# Patient Record
Sex: Female | Born: 1955 | Race: Black or African American | Marital: Married | Smoking: Never smoker
Health system: Southern US, Community
[De-identification: ages and names within clinical notes are randomized; demographics above are authoritative.]

## PROBLEM LIST (undated history)

## (undated) DIAGNOSIS — I639 Cerebral infarction, unspecified: Secondary | ICD-10-CM

---
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
## 2012-12-17 ENCOUNTER — Other Ambulatory Visit: Payer: Self-pay | Admitting: Gastroenterology

## 2012-12-17 DIAGNOSIS — D3A00 Benign carcinoid tumor of unspecified site: Secondary | ICD-10-CM

## 2012-12-19 ENCOUNTER — Ambulatory Visit
Admission: RE | Admit: 2012-12-19 | Discharge: 2012-12-19 | Disposition: A | Discharge status: Home / Self Care | Payer: 59 | Source: Ambulatory Visit | Attending: Gastroenterology | Admitting: Gastroenterology

## 2012-12-19 DIAGNOSIS — D3A00 Benign carcinoid tumor of unspecified site: Secondary | ICD-10-CM

## 2012-12-19 MED ORDER — IOHEXOL 300 MG/ML  SOLN
100.0000 mL | Freq: Once | INTRAMUSCULAR | Status: AC | PRN
  Administered 2012-12-19: 100 mL via INTRAVENOUS

## 2012-12-24 ENCOUNTER — Other Ambulatory Visit: Payer: Self-pay | Admitting: Gastroenterology

## 2012-12-24 DIAGNOSIS — R935 Abnormal findings on diagnostic imaging of other abdominal regions, including retroperitoneum: Secondary | ICD-10-CM

## 2012-12-29 ENCOUNTER — Inpatient Hospital Stay: Admission: RE | Admit: 2012-12-29 | Payer: 59 | Source: Ambulatory Visit

## 2013-01-13 ENCOUNTER — Other Ambulatory Visit: Payer: 59

## 2013-01-17 ENCOUNTER — Ambulatory Visit
Admission: RE | Admit: 2013-01-17 | Discharge: 2013-01-17 | Disposition: A | Discharge status: Home / Self Care | Payer: 59 | Source: Ambulatory Visit | Attending: Gastroenterology | Admitting: Gastroenterology

## 2013-01-17 DIAGNOSIS — R935 Abnormal findings on diagnostic imaging of other abdominal regions, including retroperitoneum: Secondary | ICD-10-CM

## 2013-01-17 MED ORDER — GADOXETATE DISODIUM 0.25 MMOL/ML IV SOLN
8.0000 mL | Freq: Once | INTRAVENOUS | Status: AC | PRN
  Administered 2013-01-17: 8 mL via INTRAVENOUS

## 2013-01-19 ENCOUNTER — Other Ambulatory Visit: Payer: 59

## 2014-10-24 IMAGING — CT CT ABD-PEL WO/W CM
4 of 8 series · 13 of 32 positions shown, 18 images · IV contrast (READICAT/WATER & [ID] OMNI 300)
Comparison: None.

CLINICAL DATA: Attention pancreas/Carcinoid tumor to the duodenum.

CT ABDOMEN AND PELVIS WITHOUT AND WITH CONTRAST
TECHNIQUE: Multidetector CT imaging of the abdomen and pelvis was
performed without contrast material in one or both body regions,
followed by contrast material(s) and further sections in one or
both body regions.
Contrast: 100mL OMNIPAQUE IOHEXOL 300 MG/ML  SOLN

[Series 3: arterial/portal venous · axial · arterial · 0.70mm/px · z∈[-290,-62]mm · 5 of 191 slices shown, 10 images]
[im 32/191  soft-tissue]
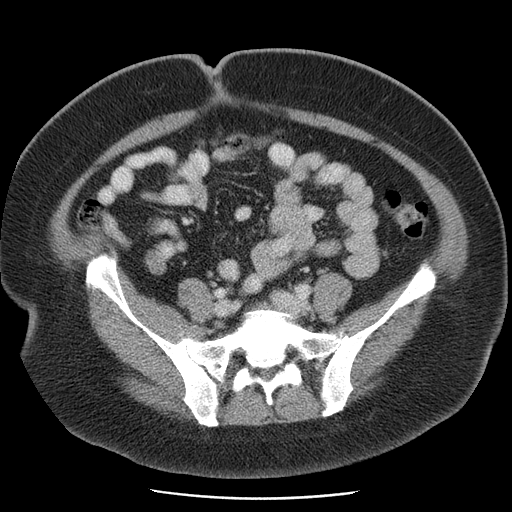
[im 32/191  bone]
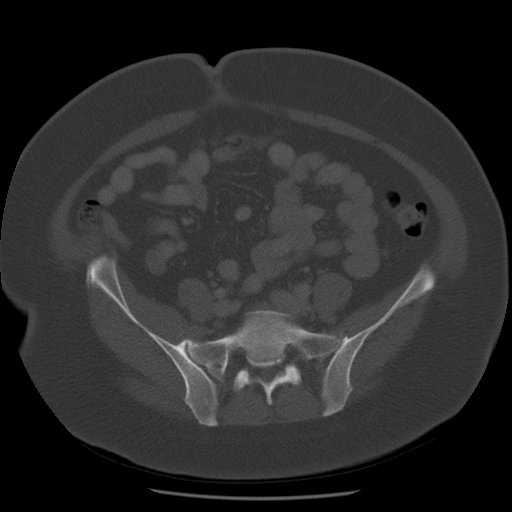
[im 64/191  soft-tissue]
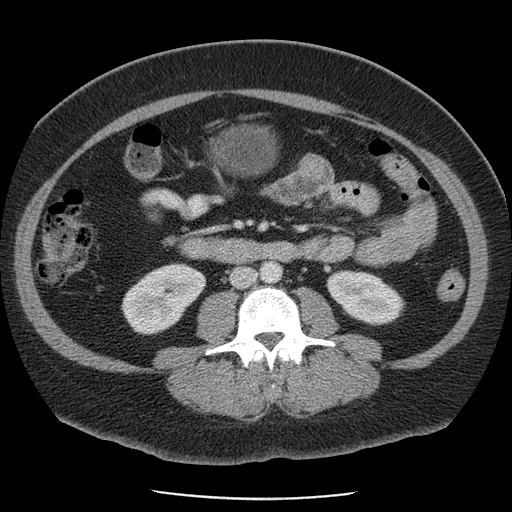
[im 64/191  lung]
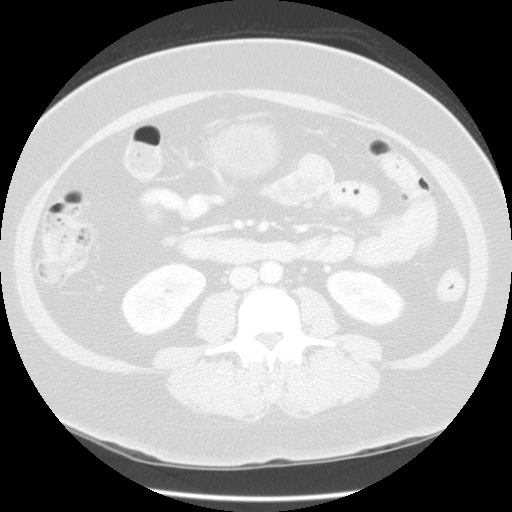
[im 96/191  soft-tissue]
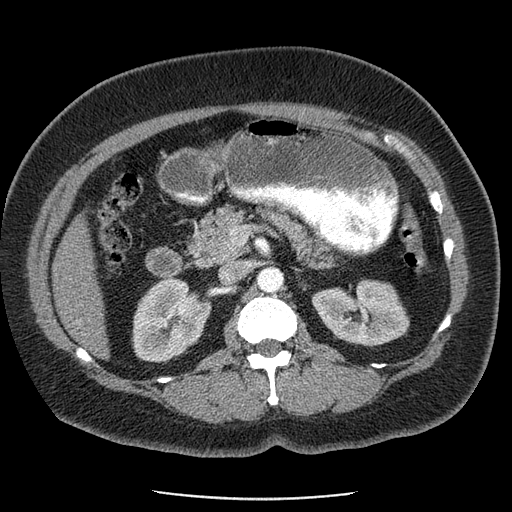
[im 96/191  lung]
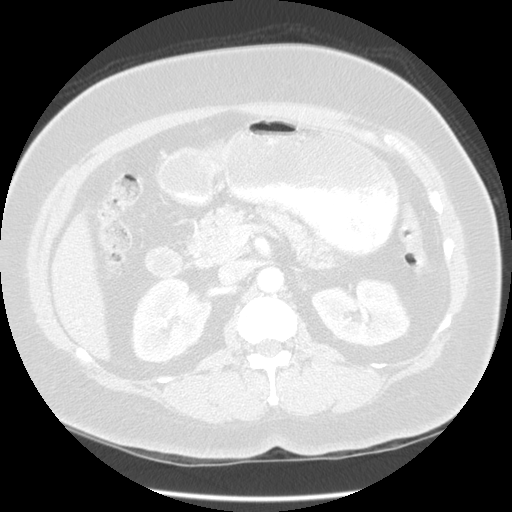
[im 127/191  soft-tissue]
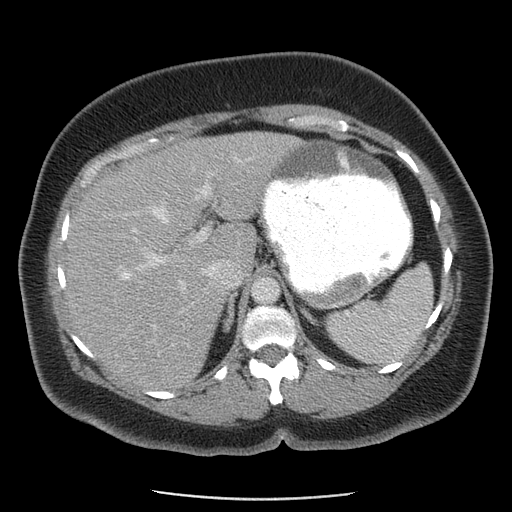
[im 127/191  lung]
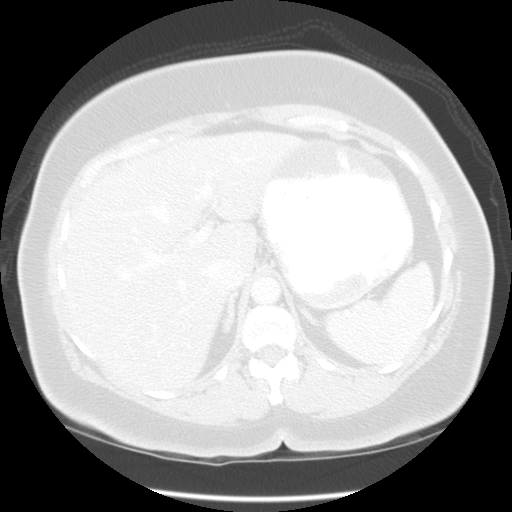
[im 159/191  soft-tissue]
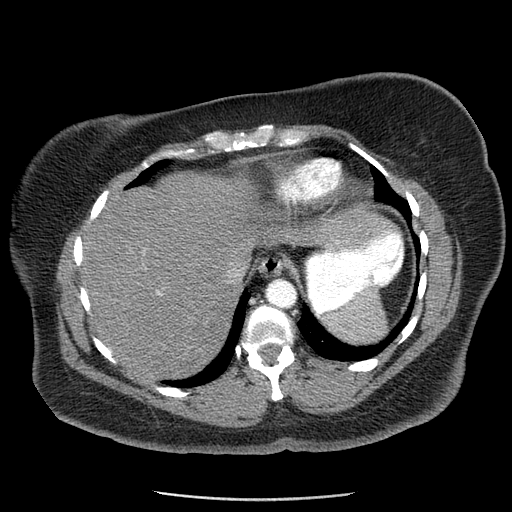
[im 159/191  lung]
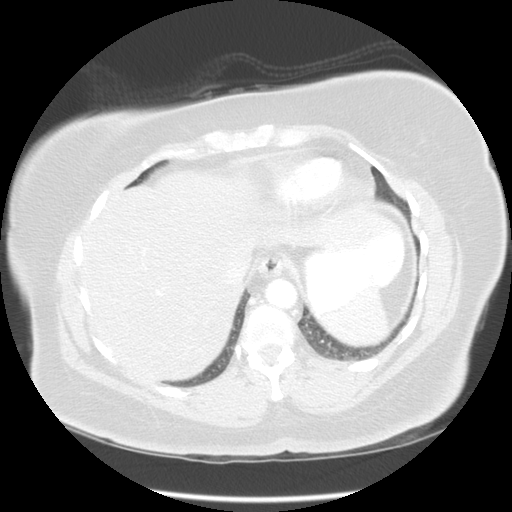

[Series 500: sag art · sagittal · 0.70mm/px · 1 of 137 slices shown]
[im 35/137  soft-tissue]
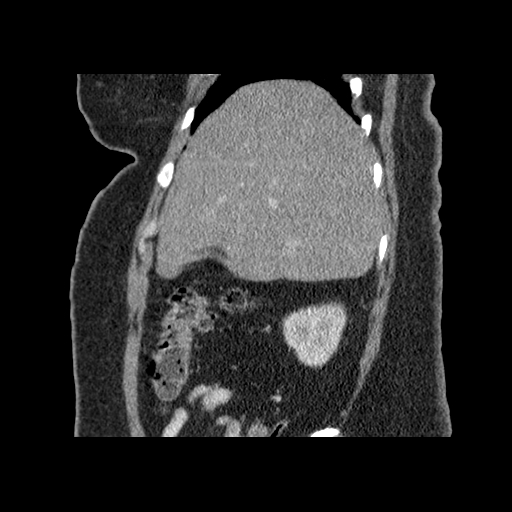

[Series 502: sag pv · sagittal · portal-venous · 0.89mm/px · 4 of 143 slices shown]
[im 29/143  soft-tissue]
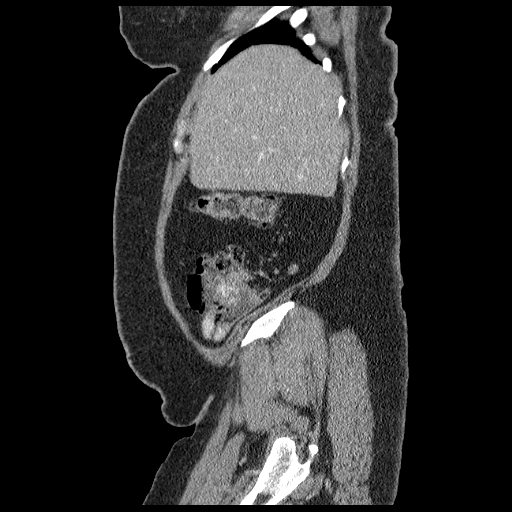
[im 57/143  soft-tissue]
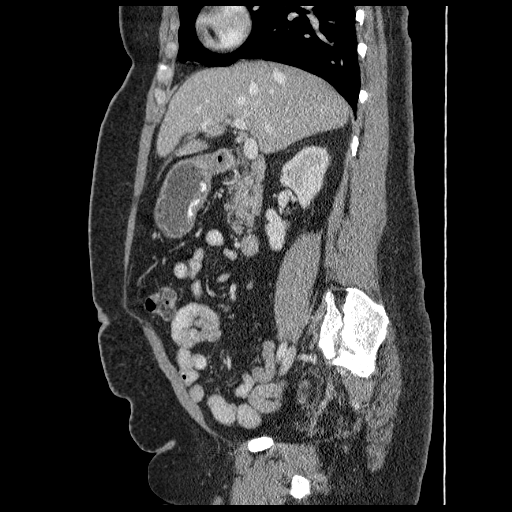
[im 86/143  soft-tissue]
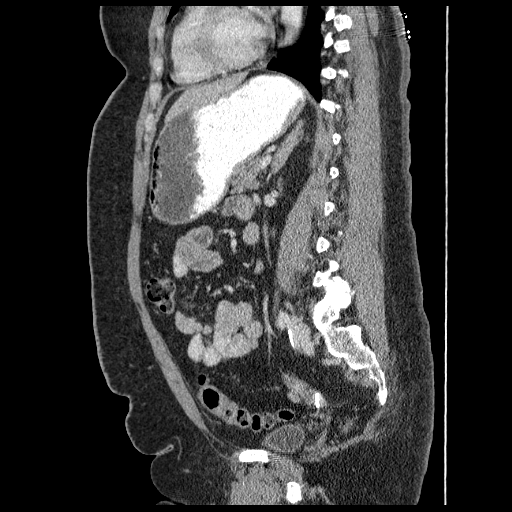
[im 114/143  soft-tissue]
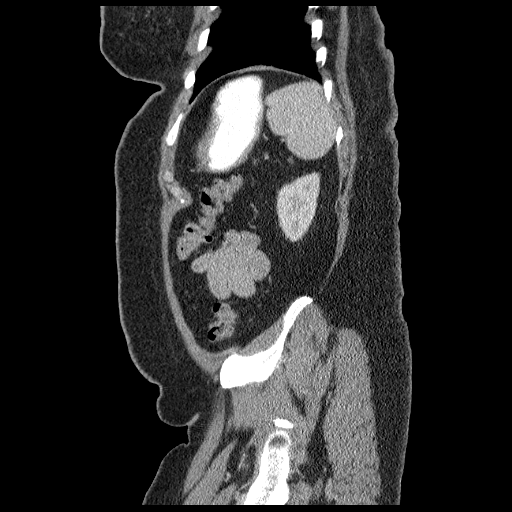

[Series 503: cor pv · coronal · portal-venous · 0.89mm/px · 3 of 120 slices shown]
[im 30/120  soft-tissue]
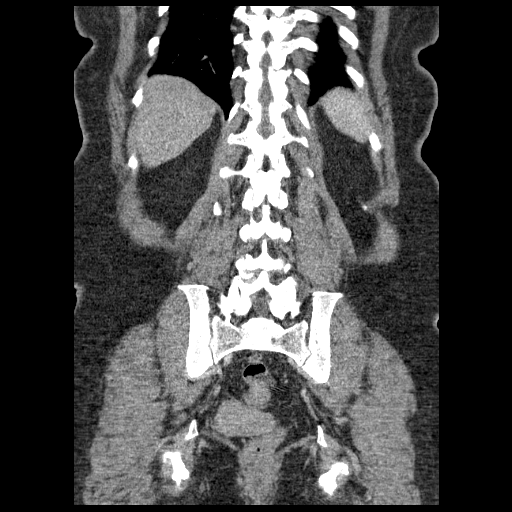
[im 60/120  soft-tissue]
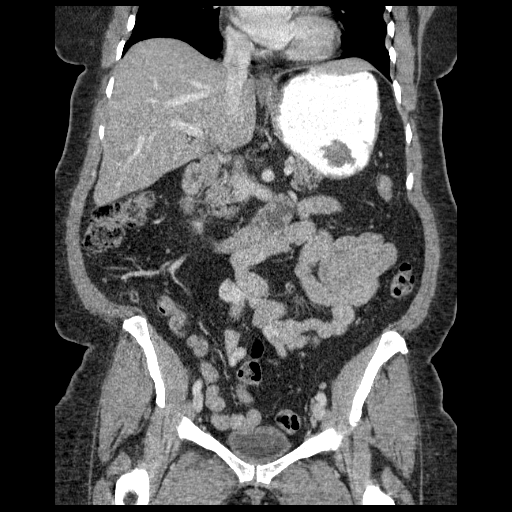
[im 90/120  soft-tissue]
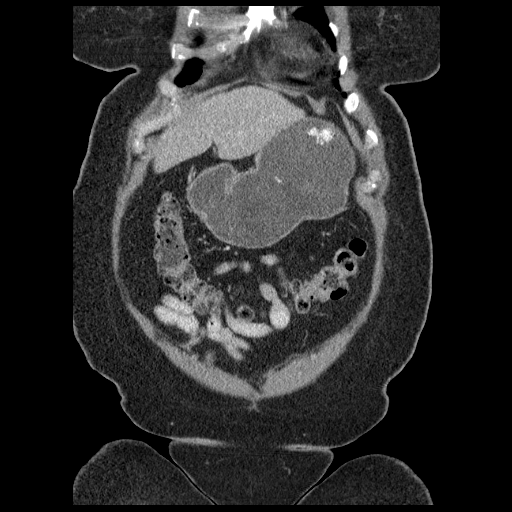

[13 of 32 positions shown; findings below may reference images not displayed]

FINDINGS: The lung bases are clear.  No pericardial or pleural
effusion.  There is no airspace consolidation within the lung
bases.

 Mild diffuse low attenuation within the liver parenchyma is
identified.  Hyperenhancing structure within the right hepatic lobe
measures approximately 1.8 cm, image 30/series 3.  Previous
cholecystectomy.  No biliary dilatation.  The pancreas is within
normal limits.  Normal appearance of the spleen.

The right adrenal gland appears normal.  Nodule within the left
adrenal gland measures approximately 1.5 cm.  Precontrast
Hounsfield units equal 32.  The right kidney is normal.  The left
kidney is normal.  Urinary bladder appears normal.  There is a
partially calcified fibroid uterus noted.  Two surgical clips are
noted within the left adnexal region.

Normal caliber of the abdominal aorta.  There is no adenopathy
noted within the upper abdomen or the pelvis.

No enlarged upper abdominal lymph nodes.  There is no pelvic or
inguinal adenopathy.  The stomach appears normal.  The small bowel
loops are unremarkable.  The appendix is visualized and appears
normal.  Normal appearance of the colon.

Review of the visualized osseous structures is negative for lytic
or sclerotic bone lesion.
IMPRESSION: 1.  No acute findings.
2.  There is no evidence for pancreatic mass.
3.  Indeterminate hyperenhancing structure within the right hepatic
lobe.  Carcinoid tumors/metastasis may be a hyperenhancing. Advise
further evaluation with contrast enhanced MRI of the liver.

## 2019-01-18 DIAGNOSIS — I639 Cerebral infarction, unspecified: Secondary | ICD-10-CM

## 2019-01-18 HISTORY — DX: Cerebral infarction, unspecified: I63.9

## 2019-05-06 ENCOUNTER — Encounter: Payer: Self-pay | Admitting: Internal Medicine

## 2019-05-11 ENCOUNTER — Other Ambulatory Visit: Payer: Self-pay

## 2019-05-11 ENCOUNTER — Encounter: Payer: Self-pay | Admitting: Nurse Practitioner

## 2019-05-11 ENCOUNTER — Ambulatory Visit: Payer: Medicare Other | Admitting: Nurse Practitioner

## 2019-05-11 DIAGNOSIS — K5900 Constipation, unspecified: Secondary | ICD-10-CM

## 2019-05-11 DIAGNOSIS — Z0000 Encounter for general adult medical examination without abnormal findings: Secondary | ICD-10-CM | POA: Insufficient documentation

## 2019-05-11 NOTE — Patient Instructions (Addendum)
Your health issues we discussed today were:   Constipation: 1. Continue taking Motegrity 2. You can add MiraLAX once a day to twice a day as needed for breakthrough constipation 3. Call us if you have any worsening or severe symptoms  Need for colonoscopy: 1. We will request your previous colonoscopy and upper endoscopy records from [place], [place] 2. We will schedule your colonoscopy for you 3. Further recommendations will be made after your colonoscopy  Overall I recommend:  1. Continue your other current medications 2. Call us if you have any questions or concerns 3. Follow-up in 3 months.   Because of recent events of COVID-19 ("Coronavirus"), follow CDC recommendations:  1. Wash your hand frequently 2. Avoid touching your face 3. Stay away from people who are sick 4. If you have symptoms such as fever, cough, shortness of breath then call your healthcare provider for further guidance 5. If you are sick, STAY AT HOME unless otherwise directed by your healthcare provider. 6. Follow directions from state and national officials regarding staying safe   At [facility] we value your feedback. You may receive a survey about your visit today. Please share your experience as we strive to create trusting relationships with our patients to provide genuine, compassionate, quality care.  We appreciate your understanding and patience as we review any laboratory studies, imaging, and other diagnostic tests that are ordered as we care for you. Our office policy is 5 business days for review of these results, and any emergent or urgent results are addressed in a timely manner for your best interest. If you do not hear from our office in 1 week, please contact us.   We also encourage the use of MyChart, which contains your medical information for your review as well. If you are not enrolled in this feature, an access code is on this after visit summary for your convenience.  Thank you for allowing [place] to be involved in your care.  It was great to see you today!  I hope you have a great summer!!

## 2019-05-11 NOTE — Progress Notes (Signed)
Primary Care Physician:  Esperanza Sheets, [place] Primary Gastroenterologist:  Dr. Gala Romney  Chief Complaint  Patient presents with  . Colonoscopy    last tcs approx 10 yrs ago  . Constipation    unable to have bm without taking meds    HPI:   Michaela Le is a 63 y.o. female who presents for constipation and to arrange for colonoscopy.  Reviewed information provided with referral including referral information indicating need for colonoscopy as well as PMH.  No history of colonoscopy in our system.  Today he states she's doing ok. Is not able to have a bowel movement "without taking something." Currently on Motegrity which has helped. Previouly tried Linzess x 1 days "and it didn't work" so she stopped it. Has also tried MiraLAX for a day, dulcolax for 1-2 days, Metamucil none of which caused a bowel movement. On Metegrity, she has a bowel movement every other day; not 100% effective; previously her stools were Bristol 1, now stools are more consistent with Bristol 4. Mid-abdominal tightness still noted. Difficulty standing up straight. Also with back tightness and soreness. CT scan in ER sowed significant stool burden. Her last colonoscopy was about 10 years ago in [place], [place], which the patient thinks was normal. Also "saw something" on an EGD. Denies abdominal pain, N/V, hematochezia, melena, fever, chills, unintentional weight loss. Feels her weight has increased. Had a recent CVA 01/18/19 and she went to [place], [place]. She was started on Plavix. Denies URI or flu-like symptoms. Denies loss of sense of taste or smell. Denies chest pain, dyspnea, dizziness, lightheadedness, syncope, near syncope. Denies any other upper or lower GI symptoms.   Past Medical History:  Diagnosis Date  . CVA (cerebral vascular accident) ([place]) 01/18/2019    History reviewed. No pertinent surgical history.  Current Outpatient Medications  Medication Sig Dispense Refill  . allopurinol  (ZYLOPRIM) 300 MG tablet Take 300 mg by mouth daily.    Marland Kitchen amLODipine (NORVASC) 10 MG tablet Take 10 mg by mouth daily.    . clopidogrel (PLAVIX) 75 MG tablet Take 75 mg by mouth daily.    . colchicine 0.6 MG tablet Take 0.6 mg by mouth daily.    . metFORMIN (GLUCOPHAGE) 500 MG tablet Take by mouth 2 (two) times daily.    . metoprolol tartrate (LOPRESSOR) 50 MG tablet Take 50 mg by mouth daily.    . Prucalopride Succinate (MOTEGRITY) 2 MG TABS Take by mouth daily.    . rosuvastatin (CRESTOR) 10 MG tablet Take 10 mg by mouth daily.    . valsartan (DIOVAN) 160 MG tablet Take 160 mg by mouth daily.    . Vitamin D, Ergocalciferol, (DRISDOL) 1.25 MG (50000 UT) CAPS capsule Take 50,000 Units by mouth every 7 (seven) days.     No current facility-administered medications for this visit.     Allergies as of 05/11/2019  . (No Known Allergies)    Family History  Problem Relation Age of Onset  . Colon cancer Neg Hx     Social History   Socioeconomic History  . Marital status: Married    Spouse name: Not on file  . Number of children: Not on file  . Years of education: Not on file  . Highest education level: Not on file  Occupational History  . Not on file  Social Needs  . Financial resource strain: Not on file  . Food insecurity    Worry: Not on file    Inability:  Not on file  . Transportation needs    Medical: Not on file    Non-medical: Not on file  Tobacco Use  . Smoking status: Never Smoker  . Smokeless tobacco: Never Used  Substance and Sexual Activity  . Alcohol use: Never    Frequency: Never  . Drug use: Never  . Sexual activity: Not on file  Lifestyle  . Physical activity    Days per week: Not on file    Minutes per session: Not on file  . Stress: Not on file  Relationships  . Social Herbalist on phone: Not on file    Gets together: Not on file    Attends religious service: Not on file    Active member of club or organization: Not on file    Attends  meetings of clubs or organizations: Not on file    Relationship status: Not on file  . Intimate partner violence    Fear of current or ex partner: Not on file    Emotionally abused: Not on file    Physically abused: Not on file    Forced sexual activity: Not on file  Other Topics Concern  . Not on file  Social History Narrative  . Not on file    Review of Systems: General: Negative for anorexia, weight loss, fever, chills, fatigue, weakness. ENT: Negative for hoarseness, difficulty swallowing , nasal congestion. CV: Negative for chest pain, angina, palpitations, dyspnea on exertion, peripheral edema.  Respiratory: Negative for dyspnea at rest, dyspnea on exertion, cough, sputum, wheezing.  GI: See history of present illness. MS: Admits low back pain.  Derm: Negative for rash or itching.  Endo: Negative for unusual weight change.  Heme: Negative for bruising or bleeding. Allergy: Negative for rash or hives.    Physical Exam: BP 138/79   Pulse 81   Temp (!) 96.6 F (35.9 C) (Temporal)   Ht 5\' 4"  (1.626 m)   Wt 175 lb (79.4 kg)   BMI 30.04 kg/m  General:   Alert and oriented. Pleasant and cooperative. Well-nourished and well-developed.  Head:  Normocephalic and atraumatic. Eyes:  Without icterus, sclera clear and conjunctiva pink.  Ears:  Normal auditory acuity. Cardiovascular:  S1, S2 present without murmurs appreciated.Extremities without clubbing or edema. Respiratory:  Clear to auscultation bilaterally. No wheezes, rales, or rhonchi. No distress.  Gastrointestinal:  +BS, soft, non-tender and non-distended. No HSM noted. No guarding or rebound. No masses appreciated.  Rectal:  Deferred  Musculoskalatal:  Symmetrical without gross deformities. Neurologic:  Alert and oriented x4;  grossly normal neurologically. Psych:  Alert and cooperative. Normal mood and affect. Heme/Lymph/Immune: No excessive bruising noted.    05/11/2019 8:42 AM   Disclaimer: This note was  dictated with voice recognition software. Similar sounding words can inadvertently be transcribed and may not be corrected upon review.

## 2019-05-15 NOTE — Assessment & Plan Note (Signed)
Bowel movements somewhat better on Motegrity but not optimal.  Recommend she had MiraLAX as needed to [place] to help on days if she does not have a good bowel movement.  Follow-up in 3 months.

## 2019-05-15 NOTE — Assessment & Plan Note (Signed)
The patient's last colonoscopy was about 10 years ago in [place], [place].  We will request her previous records.  Per primary care records she is currently due.  At this point we will proceed with colonoscopy.  Further recommendations to be made after colonoscopy.  Proceed with TCS with Dr. Gala Romney in near future: the risks, benefits, and alternatives have been discussed with the patient in detail. The patient states understanding and desires to proceed.  The patient is currently on Plavix and metformin.  No other anticoagulants, anxiolytics, chronic pain medications, antidepressants, antidiabetics, or iron supplements.  We will schedule the procedure on conscious sedation which should be adequate for her procedure.

## 2019-05-18 ENCOUNTER — Other Ambulatory Visit: Payer: Self-pay | Admitting: *Deleted

## 2019-05-18 ENCOUNTER — Telehealth: Payer: Self-pay | Admitting: *Deleted

## 2019-05-18 DIAGNOSIS — Z1211 Encounter for screening for malignant neoplasm of colon: Secondary | ICD-10-CM

## 2019-05-18 MED ORDER — PEG 3350-KCL-NA BICARB-NACL 420 G PO SOLR: 4000.0000 mL | Freq: Once | ORAL | 0 refills | Status: AC

## 2019-05-18 NOTE — Telephone Encounter (Signed)
Noted  

## 2019-05-18 NOTE — Telephone Encounter (Signed)
Called patient. She is scheduled for 07/08/2019 at 1:30pm for procedure. Patient requested afternoon appt.  Aware she will need COVID-19 testing prior. She is scheduled for 10/20 at 10:00am. Aware of location. Patient aware will send prep Rx to pharmacy. I have mailed instructions (confirmed address).   Called [employer] and spoke with Angj L, was advised PA was required. I was transferred to PA dept. I spoke with [facility]. We are considered out of network as we are out of state and PA will be required. After further investigating she found that patient has providers within 20 miles of her that could provide same services that are in network.   I called patient and made her aware. She was going to contact her insurance before she makes a decision and will let us know. FYI to EG

## 2019-05-18 NOTE — Telephone Encounter (Signed)
Per EG patient needs to be scheduled for TCS with RMR. No metformin morning of procedure.

## 2019-06-05 ENCOUNTER — Ambulatory Visit: Payer: Medicare Other | Admitting: Gastroenterology

## 2019-07-07 ENCOUNTER — Other Ambulatory Visit: Payer: Self-pay

## 2019-07-07 ENCOUNTER — Other Ambulatory Visit (HOSPITAL_COMMUNITY)
Admission: RE | Admit: 2019-07-07 | Discharge: 2019-07-07 | Disposition: A | Discharge status: Home / Self Care | Payer: Medicare Other | Source: Ambulatory Visit | Attending: Internal Medicine | Admitting: Internal Medicine

## 2019-07-07 ENCOUNTER — Telehealth: Payer: Self-pay

## 2019-07-07 NOTE — Telephone Encounter (Signed)
Melanie at [facility] called office, pt was no show for COVID test today. TCS for tomorrow is cancelled.  Tried to call pt, no answer, LMOVM to inform pt procedure was cancelled. She can call office to reschedule if she wants to proceed with TCS.   FYI to EG.

## 2019-07-07 NOTE — Telephone Encounter (Signed)
Melanie informed unable to reach pt and LMOVM.

## 2019-07-07 NOTE — OR Nursing (Signed)
Attempted to call patient on her home phone and cell phone and got no answer.

## 2019-07-08 ENCOUNTER — Encounter (HOSPITAL_COMMUNITY): Admission: RE | Payer: Self-pay | Source: Home / Self Care

## 2019-07-08 ENCOUNTER — Ambulatory Visit (HOSPITAL_COMMUNITY): Admission: RE | Admit: 2019-07-08 | Payer: Medicare Other | Source: Home / Self Care | Admitting: Internal Medicine

## 2019-07-08 SURGERY — COLONOSCOPY
Anesthesia: Moderate Sedation

## 2019-07-08 NOTE — Telephone Encounter (Signed)
Noted  

## 2019-08-18 ENCOUNTER — Ambulatory Visit: Payer: Medicare Other | Admitting: Nurse Practitioner

## 2020-06-20 ENCOUNTER — Ambulatory Visit: Payer: Self-pay | Admitting: "Endocrinology

## 2020-07-05 ENCOUNTER — Ambulatory Visit: Payer: Self-pay | Admitting: "Endocrinology

## 2023-04-30 ENCOUNTER — Encounter: Payer: Self-pay | Admitting: Gastroenterology

## 2023-04-30 ENCOUNTER — Ambulatory Visit (INDEPENDENT_AMBULATORY_CARE_PROVIDER_SITE_OTHER): Payer: Medicare Other | Admitting: Gastroenterology

## 2023-04-30 VITALS — BP 147/83 | HR 62 | Temp 98.3°F | Ht 65.0 in | Wt 205.0 lb

## 2023-04-30 DIAGNOSIS — K219 Gastro-esophageal reflux disease without esophagitis: Secondary | ICD-10-CM | POA: Diagnosis not present

## 2023-04-30 DIAGNOSIS — K529 Noninfective gastroenteritis and colitis, unspecified: Secondary | ICD-10-CM | POA: Diagnosis not present

## 2023-04-30 NOTE — Progress Notes (Addendum)
GI Office Note    Referring Provider: Jonathon Bellows, DO Primary Care Physician:  Jonathon Bellows, DO  Primary Gastroenterologist: Gerrit Friends.Rourk, MD    Chief Complaint   Chief Complaint  Patient presents with   Diarrhea    States that it is uncontrollable and that she has to take imodium.   History of Present Illness   Michaela Le is a 67 y.o. female presenting today at the request of Jonathon Bellows, DO for diarrhea/change in bowel habits.  Last office visit in the clinic was in August 2020.  Patient was having constipation and was currently on Motegrity.  Had previously tried Linzess without relief as well as MiraLAX and Dulcolax.  On Motegrity she was having a bowel movement every other day that was not 100% effective.  Also with mid abdominal tightness and difficulty to standing straight up.  Have reported a colonoscopy about 10 years prior in [place].  Patient reported a recent stroke in May 2020 and was started on Plavix.  She was advised to add MiraLAX as needed along with her Motegrity for good bowel movements.  Scheduled for colonoscopy.  Patient was scheduled for colonoscopy in October 2020 however patient did not show for COVID test therefore procedure was canceled.  Per review of referral paperwork she has a history of anemia, CAD, stroke, hypertension, diabetes, HLD, GERD, and gout.  Labs in July with normal hemoglobin and platelets, elevated BUN and glucose.  AST 41, ALT 37, T. bili 0.5.  Normal alk phos.   Today: Reports she has been having diarrhea for about 1-2 years now. Stool si runny and she has had accidents. Has a lot of urgency and will start to come down before she can get there (states this is her main issue). Has been taking imodium and they help. Does not take them every day. Has been taking it off and on for about a year. Tries not to even do it once per week. Takes a whole gel capsule. Is not able to tolerate the liquid. Goes 1-2 times per day.  Stool is bristol 6 at times. Coffee will send her to the bathroom. Sometimes meals will make her have more stool. Sometimes her stools may be a more normal type. Has had dark stools with pepto use.   Has had 2 occurrences of an accident due to nocturnal stool. Feel out of bed 2 weeks ago trying to get up in the middle of the night to get to the bathroom. No new medications. Stopped metformin about a year ago, is still on ozempic.   When she stands up she has a lot of urgency. Denies melena, brbpr, abdominal pain. Does have bloating. No upper GI complaints.   She reports she had a colonoscopy in [place] in late 2020. She was advised 10 year follow up.    Current Outpatient Medications  Medication Sig Dispense Refill   allopurinol (ZYLOPRIM) 300 MG tablet Take 300 mg by mouth daily.     amLODipine (NORVASC) 10 MG tablet Take 10 mg by mouth daily.     clopidogrel (PLAVIX) 75 MG tablet Take 75 mg by mouth daily.     furosemide (LASIX) 20 MG tablet Take 1 tablet by mouth daily.     hydrochlorothiazide (HYDRODIURIL) 25 MG tablet TAKE ONE TABLET BY MOUTH DAILY AT 9 AM AS DIRECTED     metoprolol tartrate (LOPRESSOR) 50 MG tablet Take 50 mg by mouth daily.     omeprazole (PRILOSEC) 20 MG capsule  Take 20 mg by mouth daily.     OZEMPIC, 2 MG/DOSE, 8 MG/3ML SOPN INJECT 2MG  SUBCUTANEOUSLY ONCE WEEKLY     potassium chloride (KLOR-CON) 10 MEQ tablet TAKE 1 BY MOUTH ONCE DAILY     rosuvastatin (CRESTOR) 10 MG tablet Take 10 mg by mouth daily.     valsartan (DIOVAN) 160 MG tablet Take 160 mg by mouth daily.     No current facility-administered medications for this visit.    Past Medical History:  Diagnosis Date   CVA (cerebral vascular accident) ([facility]) 01/18/2019    No past surgical history on file.  Family History  Problem Relation Age of Onset   Colon cancer Neg Hx     Allergies as of 04/30/2023   (No Known Allergies)    Social History   Socioeconomic History   Marital status: Married     Spouse name: Not on file   Number of children: Not on file   Years of education: Not on file   Highest education level: Not on file  Occupational History   Not on file  Tobacco Use   Smoking status: Never   Smokeless tobacco: Never  Substance and Sexual Activity   Alcohol use: Never   Drug use: Never   Sexual activity: Not on file  Other Topics Concern   Not on file  Social History Narrative   Not on file   Social Determinants of Health   Financial Resource Strain: Not on file  Food Insecurity: Not on file  Transportation Needs: Not on file  Physical Activity: Not on file  Stress: Not on file  Social Connections: Not on file  Intimate Partner Violence: Not on file   Review of Systems   Gen: Denies any fever, chills, fatigue, weight loss, lack of appetite.  CV: Denies chest pain, heart palpitations, peripheral edema, syncope.  Resp: Denies shortness of breath at rest or with exertion. Denies wheezing or cough.  GI: see HPI GU : Denies urinary burning, urinary frequency, urinary hesitancy MS: Denies joint pain, muscle weakness, cramps, or limitation of movement.  Derm: Denies rash, itching, dry skin Psych: Denies depression, anxiety, memory loss, and confusion Heme: Denies bruising, bleeding, and enlarged lymph nodes.  Physical Exam   BP (!) 147/83 (BP Location: Right Arm, Patient Position: Sitting, Cuff Size: Normal)   Pulse 62   Temp 98.3 F (36.8 C) (Oral)   Ht 5\' 5"  (1.651 m)   Wt 205 lb (93 kg)   SpO2 98%   BMI 34.11 kg/m   General:   Alert and oriented. Pleasant and cooperative. Well-nourished and well-developed.  Head:  Normocephalic and atraumatic. Eyes:  Without icterus, sclera clear and conjunctiva pink.  Ears:  Normal auditory acuity. Mouth:  No deformity or lesions, oral mucosa pink.  Lungs:  Clear to auscultation bilaterally. No wheezes, rales, or rhonchi. No distress.  Heart:  S1, S2 present without murmurs appreciated.  Abdomen:  +BS, soft,  non-tender and non-distended. No HSM noted. No guarding or rebound. No masses appreciated.  Rectal:  Deferred  Msk:  Symmetrical without gross deformities. Normal posture. Extremities:  Without edema. Neurologic:  Alert and  oriented x4;  grossly normal neurologically. Skin:  Intact without significant lesions or rashes. Psych:  Alert and cooperative. Normal mood and affect.   Assessment   Michaela Le is a 67 y.o. female with a history of anemia, CAD, stroke, hypertension, diabetes, HLD, GERD, and gout presenting today for evaluation of chronic diarrhea  Chronic diarrhea: Symptoms  ongoing for 1-2 years.  Was seen about 4 years ago for constipation which was usually her baseline as she was on Motegrity and having to take MiraLAX.  She reports no new medications regarding time and symptoms began.  Stools are watery/very loose in nature and usually occur 1-2 times per day.  At times has been worsening bowel movements with meals, especially coffee.  Denies any overt abdominal pain.  Has been taking Imodium as needed which does seem to help.  Has had a couple nocturnal stools with accidents and continues to have urgency.  States last colonoscopy was in 2020 in [place].  We will request records.  Wide range of differentials including celiac, microscopic colitis, alpha gal, and unable to exclude EPI given history of diabetes.  Unlikely infectious given the chronic nature of her symptoms.  Will check celiac serologies, TSH, CRP and fecal calprotectin as well as alpha gal and fecal elastase as patient would like comprehensive workup.  We discussed that overall things are negative we likely would need to complete a colonoscopy given her change from chronic constipation to chronic diarrhea and to evaluate for microscopic colitis.  For now advised her to avoid dairy products and that she may continue to use Imodium on a daily or every other day basis and to hold in setting of constipation.  Advised trial of  probiotics to help with diarrhea and bloating.  GERD: Controlled with once daily omeprazole 20 mg.  PLAN   TSH, CRP, celiac serologies ,fecal calprotectin, alpha gal, fecal elastase Continue imodium daily or everyday, 1 mg or 2 mg. Hold for constipation.  Avoid dairy products Request [facility] [place].  Continue omeprazole 20 mg once daily.  Can consider changing this if ongoing diarrhea. If all testing negative, may need to consider colonoscopy. Will give visibiome samples Follow-up in 2 months   Brooke Bonito, MSN, FNP-BC, AGACNP-BC [facility]  Addendum: Reviewed patient's colonoscopy records from [facility].  Colonoscopy in October 2020 was normal other than pancolonic diverticulosis.  10-year repeat recommended.  Records within the media tab.

## 2023-04-30 NOTE — Patient Instructions (Addendum)
Please have blood work completed at [facility].  We will call you with results once they have been received. Please allow 3-5 business days for review. 2 locations for [facility] in [place]:              1. [address], [place]              2. [address] [place]   If you would like you could also go to your local [facility] in [place] [address] [place], [place] [address]  For now to help manage your diarrhea you can take Imodium 1 mg daily or every other day to help control symptoms.  If this does not seem to help you can increase it back to 2 mg.  Packaging usually comes in 2 mg tablets that you can cut in half.  While we await your labs and stool studies I would recommend that you avoid dairy products is much as possible as lactose intolerance is to be a significant factor in bloating and diarrhea.  As we discussed, cholestyramine/colestid is an option going forward to help with diarrhea given you no longer have a gallbladder.  Please fill out the records request form at the front desk prior to leaving.  We will follow-up in 2 months, sooner if needed  It was a pleasure to see you today. I want to create trusting relationships with patients. If you receive a survey regarding your visit,  I greatly appreciate you taking time to fill this out on paper or through your MyChart. I value your feedback.  Brooke Bonito, MSN, FNP-BC, AGACNP-BC [facility]

## 2023-06-12 ENCOUNTER — Encounter: Payer: Self-pay | Admitting: Gastroenterology
